# Patient Record
Sex: Female | Born: 1950 | Race: White | Hispanic: No | Smoking: Never smoker
Health system: Southern US, Community
[De-identification: ages and names within clinical notes are randomized; demographics above are authoritative.]

## PROBLEM LIST (undated history)

## (undated) HISTORY — PX: REPLACEMENT TOTAL HIP W/  RESURFACING IMPLANTS: SUR1222

---

## 2004-09-10 ENCOUNTER — Other Ambulatory Visit: Payer: Self-pay

## 2004-09-19 ENCOUNTER — Inpatient Hospital Stay: Payer: Self-pay | Admitting: Unknown Physician Specialty

## 2005-06-18 ENCOUNTER — Ambulatory Visit: Payer: Self-pay

## 2006-06-24 ENCOUNTER — Ambulatory Visit: Payer: Self-pay

## 2007-03-31 ENCOUNTER — Ambulatory Visit: Payer: Self-pay | Admitting: Gastroenterology

## 2007-07-13 ENCOUNTER — Ambulatory Visit: Payer: Self-pay

## 2007-09-09 ENCOUNTER — Ambulatory Visit: Payer: Self-pay

## 2007-09-13 ENCOUNTER — Ambulatory Visit: Payer: Self-pay

## 2008-08-01 ENCOUNTER — Ambulatory Visit: Payer: Self-pay

## 2008-09-18 ENCOUNTER — Ambulatory Visit: Payer: Self-pay | Admitting: Unknown Physician Specialty

## 2008-09-27 ENCOUNTER — Inpatient Hospital Stay: Payer: Self-pay | Admitting: Unknown Physician Specialty

## 2008-09-27 ENCOUNTER — Ambulatory Visit: Payer: Self-pay | Admitting: Cardiology

## 2008-12-19 ENCOUNTER — Encounter: Payer: Self-pay | Admitting: Unknown Physician Specialty

## 2008-12-24 ENCOUNTER — Encounter: Payer: Self-pay | Admitting: Unknown Physician Specialty

## 2009-09-24 ENCOUNTER — Ambulatory Visit: Payer: Self-pay

## 2009-10-08 ENCOUNTER — Ambulatory Visit: Payer: Self-pay

## 2010-10-20 ENCOUNTER — Ambulatory Visit: Payer: Self-pay | Admitting: Gastroenterology

## 2010-10-21 ENCOUNTER — Ambulatory Visit: Payer: Self-pay

## 2010-10-22 LAB — PATHOLOGY REPORT

## 2011-10-27 ENCOUNTER — Ambulatory Visit: Payer: Self-pay

## 2014-03-05 ENCOUNTER — Ambulatory Visit: Payer: Self-pay | Admitting: Gastroenterology

## 2014-12-14 ENCOUNTER — Emergency Department: Payer: Self-pay | Admitting: Emergency Medicine

## 2015-11-13 ENCOUNTER — Emergency Department
Admission: EM | Admit: 2015-11-13 | Discharge: 2015-11-14 | Disposition: A | Discharge status: Home / Self Care | Payer: 59 | Attending: Emergency Medicine | Admitting: Emergency Medicine

## 2015-11-13 ENCOUNTER — Encounter: Payer: Self-pay | Admitting: Urgent Care

## 2015-11-13 DIAGNOSIS — R42 Dizziness and giddiness: Secondary | ICD-10-CM | POA: Diagnosis not present

## 2015-11-13 LAB — CBC WITH DIFFERENTIAL/PLATELET
BASOS PCT: 1 %
Basophils Absolute: 0.1 10*3/uL (ref 0–0.1)
Eosinophils Absolute: 0.2 10*3/uL (ref 0–0.7)
Eosinophils Relative: 3 %
HEMATOCRIT: 36.9 % (ref 35.0–47.0)
HEMOGLOBIN: 11.8 g/dL — AB (ref 12.0–16.0)
LYMPHS PCT: 27 %
Lymphs Abs: 2.1 10*3/uL (ref 1.0–3.6)
MCH: 26.4 pg (ref 26.0–34.0)
MCHC: 31.9 g/dL — AB (ref 32.0–36.0)
MCV: 82.9 fL (ref 80.0–100.0)
MONOS PCT: 11 %
Monocytes Absolute: 0.8 10*3/uL (ref 0.2–0.9)
NEUTROS ABS: 4.6 10*3/uL (ref 1.4–6.5)
NEUTROS PCT: 58 %
Platelets: 271 10*3/uL (ref 150–440)
RBC: 4.45 MIL/uL (ref 3.80–5.20)
RDW: 15.8 % — AB (ref 11.5–14.5)
WBC: 7.7 10*3/uL (ref 3.6–11.0)

## 2015-11-13 LAB — TROPONIN I: Troponin I: <0.03 ng/mL (ref <0.031)

## 2015-11-13 LAB — COMPREHENSIVE METABOLIC PANEL
ALK PHOS: 106 U/L (ref 38–126)
ALT: 16 U/L (ref 14–54)
AST: 16 U/L (ref 15–41)
Albumin: 3.9 g/dL (ref 3.5–5.0)
Anion gap: 6 (ref 5–15)
BUN: 18 mg/dL (ref 6–20)
CALCIUM: 8.8 mg/dL — AB (ref 8.9–10.3)
CO2: 27 mmol/L (ref 22–32)
Chloride: 109 mmol/L (ref 101–111)
Creatinine, Ser: 0.74 mg/dL (ref 0.44–1.00)
GFR calc non Af Amer: >60 mL/min (ref >60)
GLUCOSE: 137 mg/dL — AB (ref 65–99)
Potassium: 3.8 mmol/L (ref 3.5–5.1)
SODIUM: 142 mmol/L (ref 135–145)
Total Bilirubin: 0.4 mg/dL (ref 0.3–1.2)
Total Protein: 6.7 g/dL (ref 6.5–8.1)

## 2015-11-13 MED ORDER — MECLIZINE HCL 25 MG PO TABS: 25.0000 mg | ORAL_TABLET | Freq: Three times a day (TID) | ORAL | Status: DC | PRN

## 2015-11-13 MED ORDER — MECLIZINE HCL 25 MG PO TABS
25.0000 mg | ORAL_TABLET | Freq: Once | ORAL | Status: AC
  Administered 2015-11-13: 25 mg via ORAL
  Filled 2015-11-13: qty 1

## 2015-11-13 NOTE — ED Provider Notes (Signed)
[facility] Emergency Department Provider Note  Time seen: 9:04 PM  I have reviewed the triage vital signs and the nursing notes.   HISTORY  Chief Complaint Dizziness    HPI Wanda Padilla is a 64 y.o. female with no past medical history who presents to the emergency department with dizziness/lightheadedness. According to the patient she donated blood 3 days ago, since that time she feels like she is getting dizzy at times throughout the day, she describes her dizziness as more of a lightheaded sensation. Denies any chest pain, shortness breath, focal weakness or numbness, Confusion or slurred speech.States at one point yesterday she was having ringing in her ear but this has resolved. Denies any ear pain. Patient states 7 or 8 years ago she was diagnosed with dizziness and given meclizine which seemed to help. Currently denies any symptoms at this time. Patient states they have a trip coming up this weekend, so she wanted to get checked out before leaving for [place].     History reviewed. No pertinent past medical history.  There are no active problems to display for this patient.   Past Surgical History  Procedure Laterality Date  . Replacement total hip w/  resurfacing implants Bilateral     No current outpatient prescriptions on file.  Allergies Review of patient's allergies indicates no known allergies.  No family history on file.  Social History Social History  Substance Use Topics  . Smoking status: Never Smoker   . Smokeless tobacco: None  . Alcohol Use: No    Review of Systems Constitutional: Negative for fever. Eyes: Negative for visual changes. ENT: Ringing in her ear yesterday now resolved. Cardiovascular: Negative for chest pain. Respiratory: Negative for shortness of breath. Gastrointestinal: Negative for abdominal pain Genitourinary: Negative for dysuria. Neurological: Negative for headaches, focal weakness or  numbness. 10-point ROS otherwise negative.  ____________________________________________   PHYSICAL EXAM:  VITAL SIGNS: ED Triage Vitals  Enc Vitals Group     BP 11/13/15 2051 158/84 mmHg     Pulse Rate 11/13/15 2051 97     Resp 11/13/15 2051 16     Temp 11/13/15 2051 98.4 F (36.9 C)     Temp Source 11/13/15 2051 Oral     SpO2 11/13/15 2051 97 %     Weight 11/13/15 2051 165 lb (74.844 kg)     Height 11/13/15 2051  (1.575 m)     Head Cir --      Peak Flow --      Pain Score 11/13/15 2052 0     Pain Loc --      Pain Edu? --      Excl. in GC? --     Constitutional: Alert and oriented. Well appearing and in no distress. Eyes: Normal exam ENT   Head: Normocephalic and atraumatic. Normal tympanic membranes.   Nose: No congestion/rhinnorhea.   Mouth/Throat: Mucous membranes are moist. Cardiovascular: Regular rhythm, rate around 100 bpm. Respiratory: Normal respiratory effort without tachypnea nor retractions. Breath sounds are clear and equal bilaterally. No wheezes/rales/rhonchi. Gastrointestinal: Soft and nontender. No distention.  Musculoskeletal: Nontender with normal range of motion in all extremities Neurologic:  Normal speech and language. No gross focal neurologic deficits  Skin:  Skin is warm, dry and intact.  Psychiatric: Mood and affect are normal. Speech and behavior are normal.   ____________________________________________    EKG  EKG reviewed and interpreted by myself shows sinus rhythm at 97 bpm, narrow QRS, normal axis, normal  intervals, no CT changes. Overall normal EKG besides a slightly high rate.  ____________________________________________      INITIAL IMPRESSION / ASSESSMENT AND PLAN / ED COURSE  Pertinent labs & imaging results that were available during my care of the patient were reviewed by me and considered in my medical decision making (see chart for details).  Patient presents with dizziness/lightheadedness. We will  check labs, EKG, urinalysis, and monitor closely in the emergency department. Overall the patient appears very well, denies any symptoms at this time.  ____________________________________________   FINAL CLINICAL IMPRESSION(S) / ED DIAGNOSES  Lightheadedness Dizziness   Minna AntisKevin Jacques Willingham, MD 11/13/15 2127

## 2015-11-13 NOTE — ED Notes (Signed)
Patient presents with c/o vertiginous symptoms that began earlier today. Patient with tinnitus to RIGHT ear. Of note, patient donated blood on Monday and thinks that may be the cause. Denies pain, blurred vision. Grossly NI in triage with no appreciated pronator drift; (+) facial symmetry noted.

## 2015-11-13 NOTE — Discharge Instructions (Signed)

## 2015-11-14 LAB — URINALYSIS COMPLETE WITH MICROSCOPIC (ARMC ONLY)
BILIRUBIN URINE: NEGATIVE
Glucose, UA: NEGATIVE mg/dL
HGB URINE DIPSTICK: NEGATIVE
Ketones, ur: NEGATIVE mg/dL
Nitrite: NEGATIVE
PH: 7 (ref 5.0–8.0)
PROTEIN: NEGATIVE mg/dL
RBC / HPF: NONE SEEN RBC/hpf (ref 0–5)
Specific Gravity, Urine: 1.012 (ref 1.005–1.030)

## 2015-11-14 MED ORDER — PREDNISONE 20 MG PO TABS
ORAL_TABLET | ORAL | Status: AC
  Filled 2015-11-14: qty 3

## 2016-12-29 ENCOUNTER — Ambulatory Visit (INDEPENDENT_AMBULATORY_CARE_PROVIDER_SITE_OTHER): Payer: 59 | Admitting: Orthopedic Surgery

## 2016-12-29 ENCOUNTER — Ambulatory Visit (INDEPENDENT_AMBULATORY_CARE_PROVIDER_SITE_OTHER): Payer: 59

## 2016-12-29 VITALS — Ht 62.0 in | Wt 165.0 lb

## 2016-12-29 DIAGNOSIS — M79672 Pain in left foot: Secondary | ICD-10-CM

## 2016-12-29 DIAGNOSIS — M21612 Bunion of left foot: Secondary | ICD-10-CM | POA: Insufficient documentation

## 2016-12-29 NOTE — Progress Notes (Signed)
   Office Visit Note   Patient: Wanda Padilla           Date of Birth: Jan 22, 1951           MRN: [id number] Visit Date: 12/29/2016              Requested by: No referring provider defined for this encounter. PCP: Danella PentonMark F Miller, MD  Chief Complaint  Patient presents with  . Left Foot - Pain    HPI: Left foot bunion pain for several months and getting worse per pt report. Here to discuss treatment options. Rodena MedinAutumn L Forrest, RMA    Assessment & Plan: Visit Diagnoses:  1. Bunion of great toe of left foot   2. Pain in left foot     Plan: Due to failure of conservative care even with using extrawide proximal shoes patient like to proceed with bunion surgery. Would plan for Chevron osteotomy first metatarsal left foot. She has slight clawing of the second toe with increased length of the second metatarsal head that she is asymptomatic and we will not address the clawing of the second toe as per the patient's request. Risk and benefits were discussed including infection neurovascular injury pain DVT need for additional surgery. 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 Plan for outpatient surgery patient will call when she wants to schedule surgery this spring.  Follow-Up Instructions: Return if symptoms worsen or fail to improve.   Ortho Exam Examination patient is alert oriented no adenopathy well-dressed normal affect normal respiratory effort she has a good dorsalis pedis and posterior tibial pulses she has good ankle and good subtalar motion. She has no pain with range of motion of the great toe MTP joint but does have pain to palpation over the prominent bunion.  Imaging: Xr Foot Complete Left  Result Date: 12/29/2016 Examination 3 views of the left foot shows a congruent MTP joint of the great toe there is slight increased length of the  second toe was slight clawing of the second toe with a moderate hallux valgus deformity.   Orders:  Orders Placed This Encounter  Procedures  . XR Foot Complete Left   No orders of the defined types were placed in this encounter.    Procedures: No procedures performed  Clinical Data: No additional findings.  Subjective: Review of Systems  Objective: Vital Signs: Ht 5\' 2"  (1.575 m)   Wt 165 lb (74.8 kg)   BMI 30.18 kg/m   Specialty Comments:  No specialty comments available.  PMFS History: Patient Active Problem List   Diagnosis Date Noted  . Bunion of great toe of left foot 12/29/2016   No past medical history on file.  No family history on file.  Past Surgical History:  Procedure Laterality Date  . REPLACEMENT TOTAL HIP W/  RESURFACING IMPLANTS Bilateral    Social History   Occupational History  . Not on file.   Social History Main Topics  . Smoking status: Never Smoker  . Smokeless tobacco: Not on file  . Alcohol use No  . Drug use: Unknown  . Sexual activity: Not on file

## 2017-01-19 DIAGNOSIS — M2012 Hallux valgus (acquired), left foot: Secondary | ICD-10-CM | POA: Diagnosis not present

## 2017-01-21 ENCOUNTER — Telehealth (INDEPENDENT_AMBULATORY_CARE_PROVIDER_SITE_OTHER): Payer: Self-pay | Admitting: Orthopedic Surgery

## 2017-01-21 NOTE — Telephone Encounter (Signed)
Patient had a few questions about her follow up appointment? CB # [phone]

## 2017-01-22 NOTE — Telephone Encounter (Signed)
Called pt and she states that her d/c paperwork stated to make an appt in one week. And she has one sch in 2. I advised she can come in 1 week for dressing rmeoval and incision evaluation and keep the 2 week appt for stitches and pin to be removed. Pt voiced understanding and appt was made while she was on the phone. To call with questions otherwise appt made for 01/26/17 at 12:30

## 2017-01-26 ENCOUNTER — Ambulatory Visit (INDEPENDENT_AMBULATORY_CARE_PROVIDER_SITE_OTHER): Payer: 59 | Admitting: Orthopedic Surgery

## 2017-01-26 ENCOUNTER — Encounter (INDEPENDENT_AMBULATORY_CARE_PROVIDER_SITE_OTHER): Payer: Self-pay | Admitting: Orthopedic Surgery

## 2017-01-26 DIAGNOSIS — M21612 Bunion of left foot: Secondary | ICD-10-CM

## 2017-01-26 NOTE — Progress Notes (Signed)
   Office Visit Note   Patient: Wanda Padilla           Date of Birth: [birth year]           MRN: [id number] Visit Date: 01/26/2017              Requested by: Danella PentonMark F Miller, MD [phone] [id number] [facility] [facility] [place], [place] [id number] PCP: Danella PentonMark F Miller, MD  Chief Complaint  Patient presents with  . Left Foot - Routine Post Op    NWG:NFAOZHYHPI:Patient is status post bunionectomy left great toe approximately 1 week out. HPI  Assessment & Plan: Visit Diagnoses:  1. Bunion of great toe of left foot     Plan: Start Dial soap cleansing antibiotic ointment to the pin track 4 x 4 plus Ace wrap continue protected weightbearing. Follow-up the office in 1 week to remove the sutures and the pen. Patient will be out of work until 3/27.  Follow-Up Instructions: Return in about 1 week (around 02/02/2017).   Ortho Exam Examination there is no redness no synovitis the wound edges are well approximated there is some mild redness she has good alignment to the foot. ROS: Complete review of systems negative. Imaging: No results found.  Labs: No results found for: HGBA1C, ESRSEDRATE, CRP, LABURIC, REPTSTATUS, GRAMSTAIN, CULT, LABORGA  Orders:  No orders of the defined types were placed in this encounter.  No orders of the defined types were placed in this encounter.    Procedures: No procedures performed  Clinical Data: No additional findings.  Subjective: Review of Systems  Objective: Vital Signs: There were no vitals taken for this visit.  Specialty Comments:  No specialty comments available.  PMFS History: Patient Active Problem List   Diagnosis Date Noted  . Bunion of great toe of left foot 12/29/2016   No past medical history on file.  No family history on file.  Past Surgical History:  Procedure Laterality Date  . REPLACEMENT TOTAL HIP W/  RESURFACING IMPLANTS Bilateral    Social History   Occupational History  . Not on file.   Social History  Main Topics  . Smoking status: Never Smoker  . Smokeless tobacco: Never Used  . Alcohol use No  . Drug use: Unknown  . Sexual activity: Not on file

## 2017-01-26 NOTE — Progress Notes (Signed)
01/19/17 left foot bunionectomy chevron osteotomy

## 2017-02-01 ENCOUNTER — Inpatient Hospital Stay (INDEPENDENT_AMBULATORY_CARE_PROVIDER_SITE_OTHER): Payer: 59 | Admitting: Orthopedic Surgery

## 2017-02-02 ENCOUNTER — Encounter (INDEPENDENT_AMBULATORY_CARE_PROVIDER_SITE_OTHER): Payer: Self-pay | Admitting: Orthopedic Surgery

## 2017-02-02 ENCOUNTER — Ambulatory Visit (INDEPENDENT_AMBULATORY_CARE_PROVIDER_SITE_OTHER): Payer: 59 | Admitting: Orthopedic Surgery

## 2017-02-02 DIAGNOSIS — M21612 Bunion of left foot: Secondary | ICD-10-CM

## 2017-02-02 NOTE — Progress Notes (Signed)
   Office Visit Note   Patient: Wanda Padilla AgeJoe Ann Padilla           Date of Birth: 1950-12-17           MRN: [id number] Visit Date: 02/02/2017              Requested by: Danella PentonMark F Miller, MD [phone] [facility] [address] [address] [facility] [place], [place] [address] PCP: Danella PentonMark F Miller, MD  Chief Complaint  Patient presents with  . Left Foot - Follow-up    Left foot bunionectomy, chevron osteotomy 01/19/17. 2 weeks post op.    HPI: Patient is a 66 y.o female who presents today for right foot bunionectomy. She is approximately 2 weeks post op. She is doing dial soap cleansing daily with antibiotic ointment dressing changes to incision and pin tract. There is minimal redness. There is no drainage. She is pleased with her progress thus far and not currently having to take anything for pain.  Donalee CitrinStepheney L Peele, RT    Assessment & Plan: Visit Diagnoses:  1. Bunion of great toe of left foot     Plan: Sutures harvested and removed start pin tract care and wound care Dial soap cleansing postoperative shoe. A spacer pad was placed in the first webspace.  Follow-Up Instructions: Return in about 2 weeks (around 02/16/2017).   Ortho Exam Examination incision is healing nicely there is no redness no synovitis no drainage no signs of infection. Her toe is straight. ROS: Complete review of systems negative. Imaging: No results found.  Labs: No results found for: HGBA1C, ESRSEDRATE, CRP, LABURIC, REPTSTATUS, GRAMSTAIN, CULT, LABORGA  Orders:  No orders of the defined types were placed in this encounter.  No orders of the defined types were placed in this encounter.    Procedures: No procedures performed  Clinical Data: No additional findings.  Subjective: Review of Systems  Objective: Vital Signs: There were no vitals taken for this visit.  Specialty Comments:  No specialty comments available.  PMFS History: Patient Active Problem List   Diagnosis Date Noted  . Bunion of great  toe of left foot 12/29/2016   History reviewed. No pertinent past medical history.  History reviewed. No pertinent family history.  Past Surgical History:  Procedure Laterality Date  . REPLACEMENT TOTAL HIP W/  RESURFACING IMPLANTS Bilateral    Social History   Occupational History  . Not on file.   Social History Main Topics  . Smoking status: Never Smoker  . Smokeless tobacco: Never Used  . Alcohol use No  . Drug use: Unknown  . Sexual activity: Not on file

## 2017-02-16 ENCOUNTER — Ambulatory Visit (INDEPENDENT_AMBULATORY_CARE_PROVIDER_SITE_OTHER): Payer: 59 | Admitting: Orthopedic Surgery

## 2017-02-16 ENCOUNTER — Encounter (INDEPENDENT_AMBULATORY_CARE_PROVIDER_SITE_OTHER): Payer: Self-pay | Admitting: Orthopedic Surgery

## 2017-02-16 VITALS — Ht 62.0 in | Wt 165.0 lb

## 2017-02-16 DIAGNOSIS — M21612 Bunion of left foot: Secondary | ICD-10-CM

## 2017-02-16 NOTE — Progress Notes (Signed)
   Office Visit Note   Patient: Wanda Padilla           Date of Birth: 23-Mar-1951           MRN: [id number] Visit Date: 02/16/2017              Requested by: Danella PentonMark F Miller, MD [phone] [facility] [facility] [place], [place] [address] PCP: Danella PentonMark F Miller, MD  Chief Complaint  Patient presents with  . Left Foot - Follow-up    01/19/17 left foot bunionectomy, chevron osteotomy ~ 1 month post op    HPI: Patient is 4 weeks status post bunion surgery left great toe H and states she is very pleased with her progress she is full weightbearing in a postoperative shoe with a custom orthotic.  Assessment & Plan: Visit Diagnoses:  1. Bunion of great toe of left foot     Plan: Advance to regular sneakers increase her activities as tolerated discussed that all the swelling may take 6 months to completely go away.  Follow-Up Instructions: Return if symptoms worsen or fail to improve.   Ortho Exam  Patient is alert, oriented, no adenopathy, well-dressed, normal affect, normal respiratory effort. Examination incision is well-healed there is no pain with range of motion of the great toe she has good dorsiflexion of the ankle there is no cellulitis no signs of infection. There is no scar keloiding.  Imaging: No results found.  Labs: No results found for: HGBA1C, ESRSEDRATE, CRP, LABURIC, REPTSTATUS, GRAMSTAIN, CULT, LABORGA  Orders:  No orders of the defined types were placed in this encounter.  No orders of the defined types were placed in this encounter.    Procedures: No procedures performed  Clinical Data: No additional findings.  ROS:  All other systems negative, except as noted in the HPI. Review of Systems  Objective: Vital Signs: Ht 5\' 2"  (1.575 m)   Wt 165 lb (74.8 kg)   BMI 30.18 kg/m   Specialty Comments:  No specialty comments available.  PMFS History: Patient Active Problem List   Diagnosis Date Noted  . Bunion of great toe of  left foot 12/29/2016   History reviewed. No pertinent past medical history.  History reviewed. No pertinent family history.  Past Surgical History:  Procedure Laterality Date  . REPLACEMENT TOTAL HIP W/  RESURFACING IMPLANTS Bilateral    Social History   Occupational History  . Not on file.   Social History Main Topics  . Smoking status: Never Smoker  . Smokeless tobacco: Never Used  . Alcohol use No  . Drug use: Unknown  . Sexual activity: Not on file

## 2017-03-02 ENCOUNTER — Other Ambulatory Visit: Payer: Self-pay | Admitting: Obstetrics and Gynecology

## 2017-03-02 DIAGNOSIS — Z1231 Encounter for screening mammogram for malignant neoplasm of breast: Secondary | ICD-10-CM

## 2017-03-23 ENCOUNTER — Ambulatory Visit
Admission: RE | Admit: 2017-03-23 | Discharge: 2017-03-23 | Disposition: A | Discharge status: Home / Self Care | Payer: 59 | Source: Ambulatory Visit | Attending: Obstetrics and Gynecology | Admitting: Obstetrics and Gynecology

## 2017-03-23 DIAGNOSIS — Z1231 Encounter for screening mammogram for malignant neoplasm of breast: Secondary | ICD-10-CM | POA: Diagnosis not present

## 2017-03-31 ENCOUNTER — Other Ambulatory Visit: Payer: Self-pay | Admitting: *Deleted

## 2017-03-31 ENCOUNTER — Inpatient Hospital Stay
Admission: RE | Admit: 2017-03-31 | Discharge: 2017-03-31 | Disposition: A | Discharge status: Home / Self Care | Payer: Self-pay | Source: Ambulatory Visit | Attending: *Deleted | Admitting: *Deleted

## 2017-03-31 DIAGNOSIS — Z9289 Personal history of other medical treatment: Secondary | ICD-10-CM

## 2018-03-30 ENCOUNTER — Other Ambulatory Visit: Payer: Self-pay | Admitting: Obstetrics and Gynecology

## 2018-03-30 DIAGNOSIS — Z1231 Encounter for screening mammogram for malignant neoplasm of breast: Secondary | ICD-10-CM

## 2018-04-04 ENCOUNTER — Ambulatory Visit
Admission: RE | Admit: 2018-04-04 | Discharge: 2018-04-04 | Disposition: A | Discharge status: Home / Self Care | Payer: Medicare Other | Source: Ambulatory Visit | Attending: Obstetrics and Gynecology | Admitting: Obstetrics and Gynecology

## 2018-04-04 DIAGNOSIS — Z1231 Encounter for screening mammogram for malignant neoplasm of breast: Secondary | ICD-10-CM | POA: Insufficient documentation

## 2019-04-10 ENCOUNTER — Other Ambulatory Visit: Payer: Self-pay | Admitting: Obstetrics and Gynecology

## 2019-04-10 DIAGNOSIS — Z1231 Encounter for screening mammogram for malignant neoplasm of breast: Secondary | ICD-10-CM

## 2019-05-08 ENCOUNTER — Ambulatory Visit
Admission: RE | Admit: 2019-05-08 | Discharge: 2019-05-08 | Disposition: A | Discharge status: Home / Self Care | Payer: Medicare HMO | Source: Ambulatory Visit | Attending: Obstetrics and Gynecology | Admitting: Obstetrics and Gynecology

## 2019-05-08 ENCOUNTER — Other Ambulatory Visit: Payer: Self-pay

## 2019-05-08 DIAGNOSIS — Z1231 Encounter for screening mammogram for malignant neoplasm of breast: Secondary | ICD-10-CM | POA: Insufficient documentation

## 2020-04-11 ENCOUNTER — Other Ambulatory Visit: Payer: Self-pay | Admitting: Obstetrics and Gynecology

## 2020-04-11 DIAGNOSIS — Z1231 Encounter for screening mammogram for malignant neoplasm of breast: Secondary | ICD-10-CM

## 2020-05-09 ENCOUNTER — Ambulatory Visit
Admission: RE | Admit: 2020-05-09 | Discharge: 2020-05-09 | Disposition: A | Discharge status: Home / Self Care | Payer: Medicare HMO | Source: Ambulatory Visit | Attending: Obstetrics and Gynecology | Admitting: Obstetrics and Gynecology

## 2020-05-09 DIAGNOSIS — Z1231 Encounter for screening mammogram for malignant neoplasm of breast: Secondary | ICD-10-CM | POA: Diagnosis present

## 2020-06-21 ENCOUNTER — Telehealth: Payer: Self-pay | Admitting: Orthopedic Surgery

## 2020-06-21 ENCOUNTER — Encounter: Payer: Self-pay | Admitting: Physician Assistant

## 2020-06-21 ENCOUNTER — Ambulatory Visit (INDEPENDENT_AMBULATORY_CARE_PROVIDER_SITE_OTHER): Payer: Medicare HMO

## 2020-06-21 ENCOUNTER — Ambulatory Visit: Payer: Medicare HMO | Admitting: Physician Assistant

## 2020-06-21 VITALS — Ht 62.0 in | Wt 165.0 lb

## 2020-06-21 DIAGNOSIS — M79672 Pain in left foot: Secondary | ICD-10-CM | POA: Diagnosis not present

## 2020-06-21 NOTE — Telephone Encounter (Signed)
Appt today at 1pm

## 2020-06-21 NOTE — Telephone Encounter (Signed)
Error. Patient phone died. No response

## 2020-06-21 NOTE — Progress Notes (Signed)
Office Visit Note   Patient: Wanda Padilla           Date of Birth: Aug 16, 1951           MRN: [id number] Visit Date: 06/21/2020              Requested by: Danella Penton, MD [phone] [facility] [address] [facility] [place],  [place] [address] PCP: Danella Penton, MD  Chief Complaint  Patient presents with  . Left Foot - Pain      HPI:  This is a pleasant woman who presents with a 3-week history of left lateral foot pain.  She denies any injury.  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  There is they were the same brand of books just not the one she normally wears.  This is the only thing she recollects.  Points to the base of the fifth metatarsal.  Hurts with weightbearing and sometimes just hurts when she is doing nothing at all  Assessment & Plan: Visit Diagnoses:  1. Pain in left foot     Plan: Cannot rule a stress stress injury to the base of fifth metatarsal or some in insertional peroneal tendinitis though she does not have pain with resisted eversion.  Have recommended a stiff shoe and topical Taryn.  I did offer her a postop shoe today but she would prefer not to use this.  She will follow-up in 2 weeks for reevaluation  Follow-Up Instructions: No follow-ups on file.   Ortho Exam  Patient is alert, oriented, no adenopathy, well-dressed, normal affect, normal respiratory effort. 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  No cellulitis no swelling she has good plantar flexion dorsiflexion strength.  No pain with either of these.  No pain with resisted eversion.  She does have focal tenderness at the base of the fifth metatarsal no clinical abnormalities noted  Imaging: XR Foot 2 Views Left  Result Date: 06/21/2020 X-rays of her left foot today demonstrate postoperative changes consistent with bunion correction.  Overall well-maintained alignment.  Cannot appreciate any fractures in the area of the base of the fifth  metatarsal where she is most tender  No images are attached to the encounter.  Labs: No results found for: HGBA1C, ESRSEDRATE, CRP, LABURIC, REPTSTATUS, GRAMSTAIN, CULT, LABORGA   Lab Results  Component Value Date   ALBUMIN 3.9 11/13/2015    No results found for: MG No results found for: VD25OH  No results found for: PREALBUMIN CBC EXTENDED Latest Ref Rng & Units 11/13/2015  WBC 3.6 - 11.0 K/uL 7.7  RBC 3.80 - 5.20 MIL/uL 4.45  HGB 12.0 - 16.0 g/dL 11.8(L)  HCT 35 - 47 % 36.9  PLT 150 - 440 K/uL 271  NEUTROABS 1.4 - 6.5 K/uL 4.6  LYMPHSABS 1.0 - 3.6 K/uL 2.1     Body mass index is 30.18 kg/m.  Orders:  Orders Placed This Encounter  Procedures  . XR Foot 2 Views Left   No orders of the defined types were placed in this encounter.    Procedures: No procedures performed  Clinical Data: No additional findings.  ROS:  All other systems negative, except as noted in the HPI. Review of Systems  Objective: Vital Signs: Ht 5\' 2"  (1.575 m)   Wt 165 lb (74.8 kg)   BMI 30.18 kg/m   Specialty Comments:  No specialty comments available.  PMFS History: Patient Active Problem List  Diagnosis Date Noted  . Bunion of great toe of left foot 12/29/2016   No past medical history on file.  Family History  Problem Relation Age of Onset  . Breast cancer Neg Hx     Past Surgical History:  Procedure Laterality Date  . REPLACEMENT TOTAL HIP W/  RESURFACING IMPLANTS Bilateral    Social History   Occupational History  . Not on file  Tobacco Use  . Smoking status: Never Smoker  . Smokeless tobacco: Never Used  Substance and Sexual Activity  . Alcohol use: No  . Drug use: Not on file  . Sexual activity: Not on file

## 2020-06-21 NOTE — Telephone Encounter (Signed)
Patient called.   She is has formed some kind of knot on her left foot. Describes a burning or stinging sensation coming from it. Requesting a call back for advice on what her next step should be   Call back: [phone]

## 2021-04-15 ENCOUNTER — Other Ambulatory Visit: Payer: Self-pay | Admitting: Obstetrics and Gynecology

## 2021-04-15 DIAGNOSIS — Z1231 Encounter for screening mammogram for malignant neoplasm of breast: Secondary | ICD-10-CM

## 2021-05-13 ENCOUNTER — Ambulatory Visit
Admission: RE | Admit: 2021-05-13 | Discharge: 2021-05-13 | Disposition: A | Discharge status: Home / Self Care | Payer: Medicare HMO | Source: Ambulatory Visit | Attending: Obstetrics and Gynecology | Admitting: Obstetrics and Gynecology

## 2021-05-13 ENCOUNTER — Other Ambulatory Visit: Payer: Self-pay

## 2021-05-13 DIAGNOSIS — Z1231 Encounter for screening mammogram for malignant neoplasm of breast: Secondary | ICD-10-CM | POA: Insufficient documentation

## 2022-04-28 ENCOUNTER — Other Ambulatory Visit: Payer: Self-pay | Admitting: Obstetrics and Gynecology

## 2022-04-28 DIAGNOSIS — Z1231 Encounter for screening mammogram for malignant neoplasm of breast: Secondary | ICD-10-CM

## 2022-05-22 ENCOUNTER — Ambulatory Visit
Admission: RE | Admit: 2022-05-22 | Discharge: 2022-05-22 | Disposition: A | Discharge status: Home / Self Care | Payer: Medicare HMO | Source: Ambulatory Visit | Attending: Obstetrics and Gynecology | Admitting: Obstetrics and Gynecology

## 2022-05-22 DIAGNOSIS — Z1231 Encounter for screening mammogram for malignant neoplasm of breast: Secondary | ICD-10-CM | POA: Insufficient documentation

## 2022-09-14 ENCOUNTER — Other Ambulatory Visit: Payer: Self-pay | Admitting: Orthopaedic Surgery

## 2022-09-14 DIAGNOSIS — M13819 Other specified arthritis, unspecified shoulder: Secondary | ICD-10-CM

## 2022-09-17 ENCOUNTER — Ambulatory Visit
Admission: RE | Admit: 2022-09-17 | Discharge: 2022-09-17 | Disposition: A | Discharge status: Home / Self Care | Payer: Medicare HMO | Source: Ambulatory Visit | Attending: Orthopaedic Surgery | Admitting: Orthopaedic Surgery

## 2022-09-17 DIAGNOSIS — M13819 Other specified arthritis, unspecified shoulder: Secondary | ICD-10-CM | POA: Diagnosis not present

## 2022-11-01 IMAGING — MG MM DIGITAL SCREENING BILAT W/ TOMO AND CAD
6 of 10 series · 6 of 30 positions shown · non-contrast
Comparison: Previous exam(s).

CLINICAL DATA: Screening.

EXAM:
DIGITAL SCREENING BILATERAL MAMMOGRAM WITH TOMOSYNTHESIS AND CAD
TECHNIQUE: Bilateral screening digital craniocaudal and mediolateral oblique
mammograms were obtained. Bilateral screening digital breast
tomosynthesis was performed. The images were evaluated with
computer-aided detection.

[L CC synth-2D]
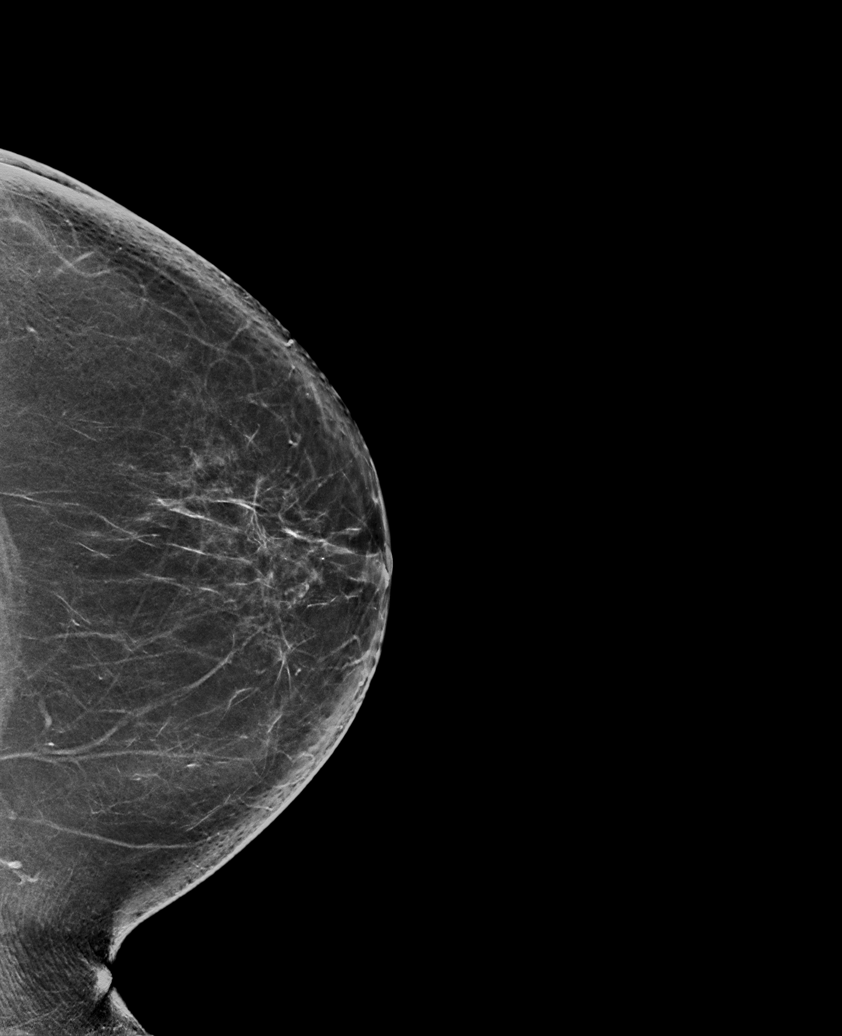

[R CC synth-2D (1 of 2)]
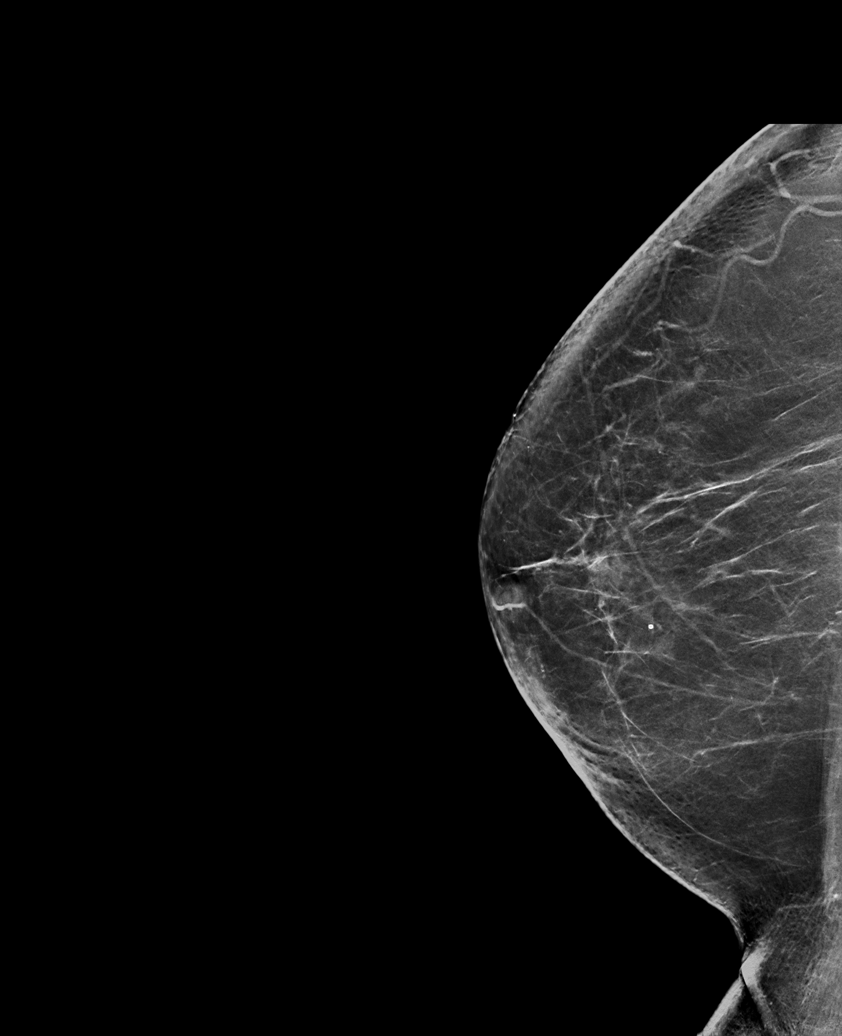

[R MLO synth-2D]
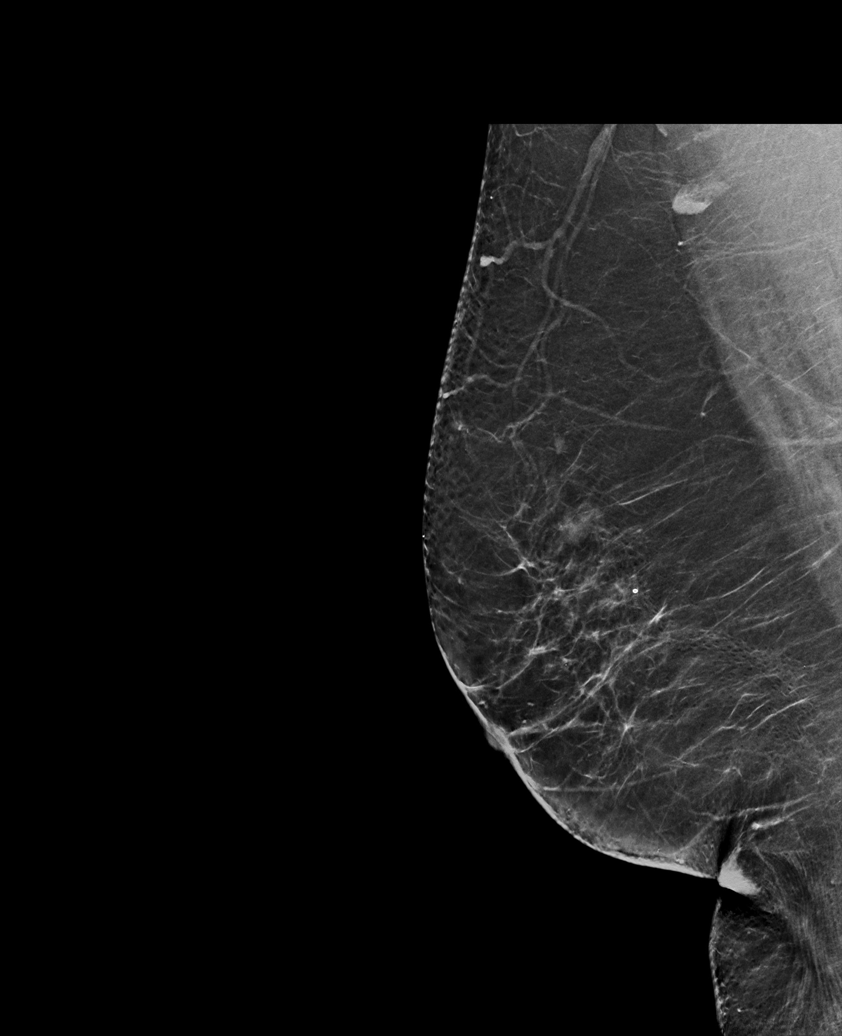

[L MLO synth-2D]
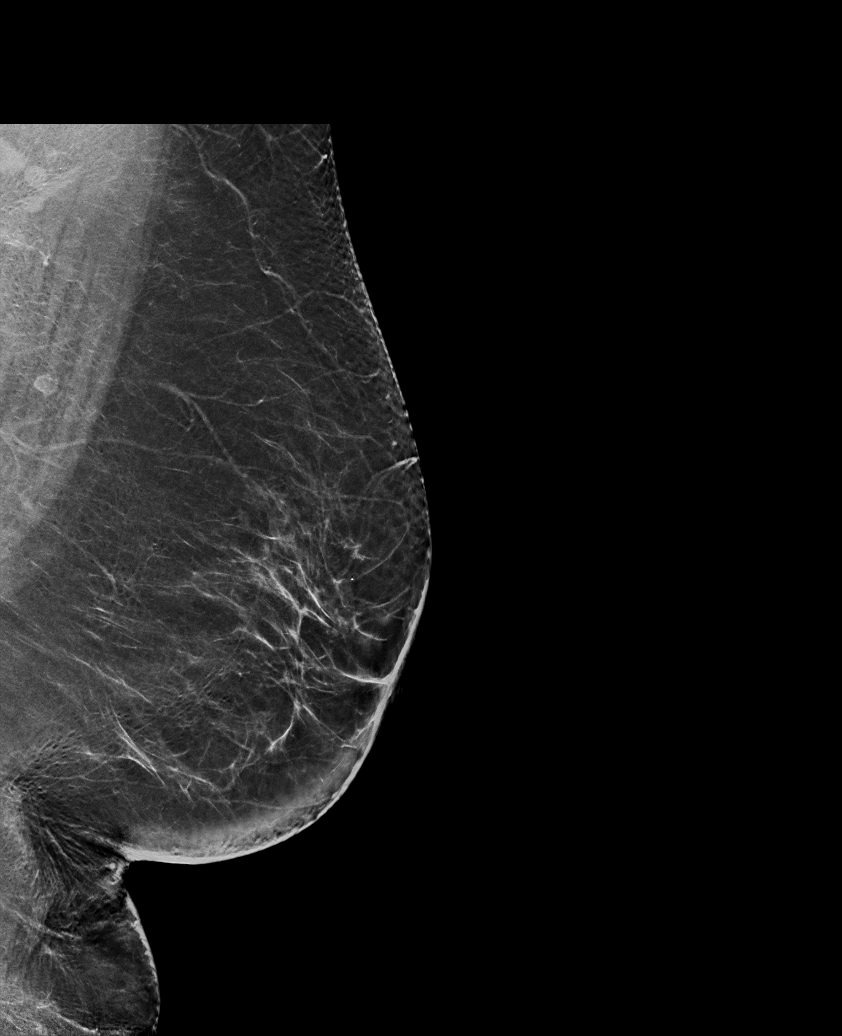

[R CC synth-2D (2 of 2)]
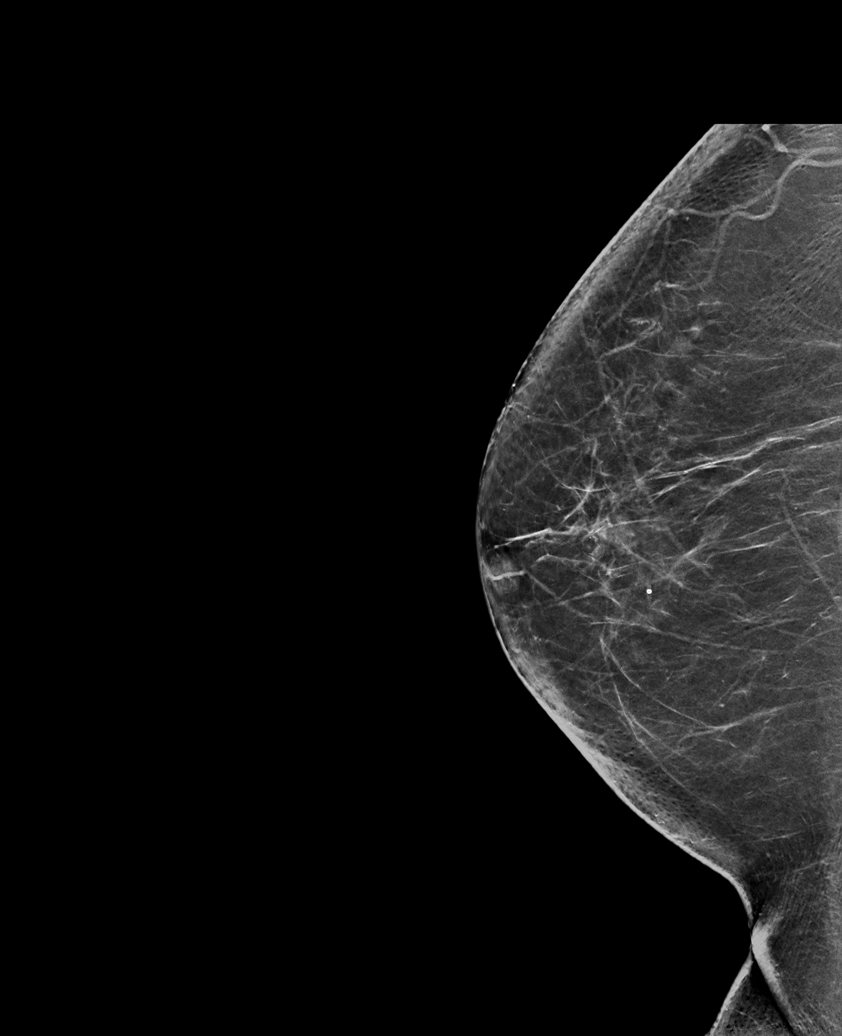

[R MLO tomo · tomo slice 39/77.0]
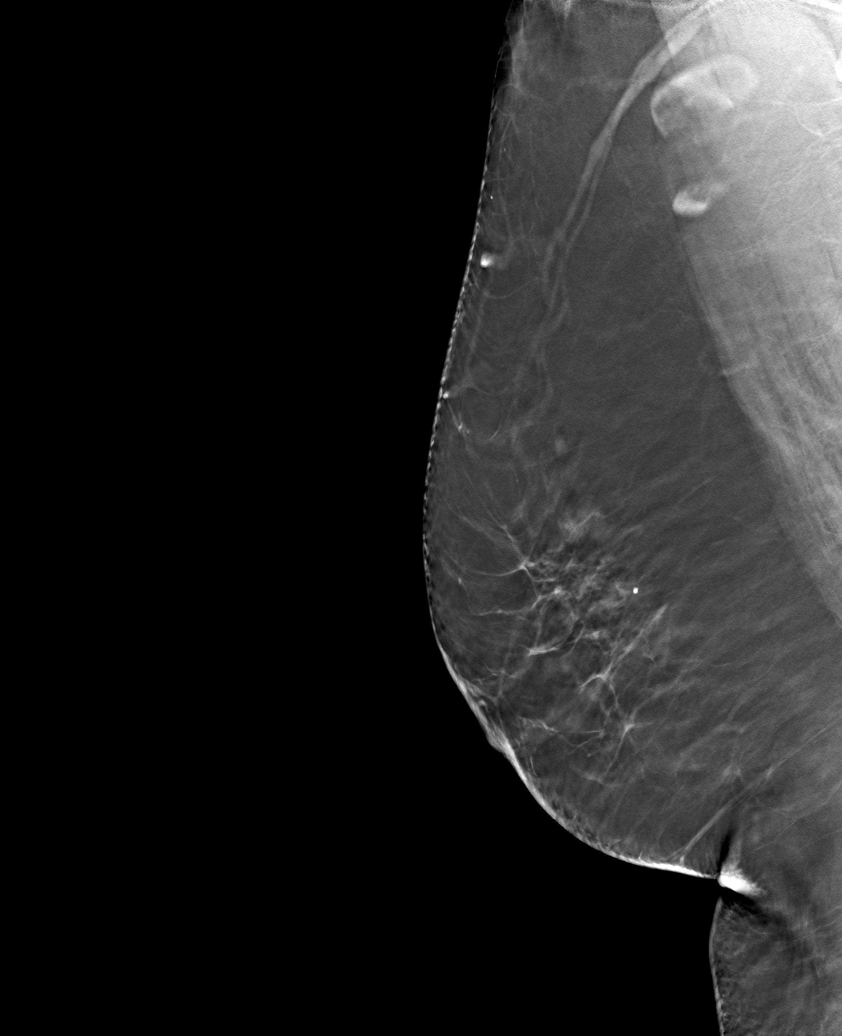

[6 of 30 positions shown; findings below may reference images not displayed]

ACR Breast Density Category b: There are scattered areas of
fibroglandular density.
FINDINGS: There are no findings suspicious for malignancy.
IMPRESSION: No mammographic evidence of malignancy. A result letter of this
screening mammogram will be mailed directly to the patient.

RECOMMENDATION:
Screening mammogram in one year. (Code:51-O-LD2)

BI-RADS CATEGORY  1: Negative.

## 2023-01-07 ENCOUNTER — Other Ambulatory Visit: Payer: Self-pay | Admitting: Obstetrics and Gynecology

## 2023-01-07 DIAGNOSIS — Z1231 Encounter for screening mammogram for malignant neoplasm of breast: Secondary | ICD-10-CM

## 2023-06-24 ENCOUNTER — Ambulatory Visit
Admission: RE | Admit: 2023-06-24 | Discharge: 2023-06-24 | Disposition: A | Discharge status: Home / Self Care | Payer: Medicare HMO | Source: Ambulatory Visit | Attending: Obstetrics and Gynecology | Admitting: Obstetrics and Gynecology

## 2023-06-24 DIAGNOSIS — Z1231 Encounter for screening mammogram for malignant neoplasm of breast: Secondary | ICD-10-CM | POA: Insufficient documentation

## 2024-05-05 ENCOUNTER — Ambulatory Visit

## 2024-05-05 DIAGNOSIS — K5730 Diverticulosis of large intestine without perforation or abscess without bleeding: Secondary | ICD-10-CM | POA: Diagnosis not present

## 2024-05-05 DIAGNOSIS — K640 First degree hemorrhoids: Secondary | ICD-10-CM | POA: Diagnosis not present

## 2024-05-05 DIAGNOSIS — Z860101 Personal history of adenomatous and serrated colon polyps: Secondary | ICD-10-CM | POA: Diagnosis not present

## 2024-05-05 DIAGNOSIS — D122 Benign neoplasm of ascending colon: Secondary | ICD-10-CM | POA: Diagnosis not present

## 2024-05-05 DIAGNOSIS — Z09 Encounter for follow-up examination after completed treatment for conditions other than malignant neoplasm: Secondary | ICD-10-CM | POA: Diagnosis present

## 2024-05-05 DIAGNOSIS — K621 Rectal polyp: Secondary | ICD-10-CM | POA: Diagnosis not present

## 2024-07-26 ENCOUNTER — Other Ambulatory Visit: Payer: Self-pay | Admitting: Internal Medicine

## 2024-07-26 DIAGNOSIS — Z1231 Encounter for screening mammogram for malignant neoplasm of breast: Secondary | ICD-10-CM

## 2024-08-18 ENCOUNTER — Ambulatory Visit
Admission: RE | Admit: 2024-08-18 | Discharge: 2024-08-18 | Disposition: A | Discharge status: Home / Self Care | Source: Ambulatory Visit | Attending: Internal Medicine | Admitting: Internal Medicine

## 2024-08-18 DIAGNOSIS — Z1231 Encounter for screening mammogram for malignant neoplasm of breast: Secondary | ICD-10-CM | POA: Diagnosis present
